# Patient Record
Sex: Male | Born: 1960
Health system: Southern US, Community
[De-identification: ages and names within clinical notes are randomized; demographics above are authoritative.]

## PROBLEM LIST (undated history)

## (undated) DIAGNOSIS — C61 Malignant neoplasm of prostate: Secondary | ICD-10-CM

## (undated) DIAGNOSIS — E039 Hypothyroidism, unspecified: Secondary | ICD-10-CM

## (undated) DIAGNOSIS — I10 Essential (primary) hypertension: Secondary | ICD-10-CM

## (undated) HISTORY — DX: Malignant neoplasm of prostate: C61

## (undated) HISTORY — DX: Essential (primary) hypertension: I10

## (undated) HISTORY — DX: Hypothyroidism, unspecified: E03.9

## (undated) HISTORY — PX: PROSTATECTOMY: SHX69

---

## 1999-05-18 ENCOUNTER — Emergency Department (HOSPITAL_COMMUNITY): Admission: EM | Admit: 1999-05-18 | Discharge: 1999-05-18 | Payer: Self-pay | Admitting: Emergency Medicine

## 2000-12-16 ENCOUNTER — Inpatient Hospital Stay (HOSPITAL_COMMUNITY): Admission: AC | Admit: 2000-12-16 | Discharge: 2001-01-15 | Payer: Self-pay

## 2000-12-16 ENCOUNTER — Encounter: Payer: Self-pay | Admitting: Emergency Medicine

## 2000-12-17 ENCOUNTER — Encounter: Payer: Self-pay | Admitting: Specialist

## 2000-12-17 ENCOUNTER — Encounter: Payer: Self-pay | Admitting: Vascular Surgery

## 2000-12-17 ENCOUNTER — Encounter: Payer: Self-pay | Admitting: Anesthesiology

## 2000-12-18 ENCOUNTER — Encounter: Payer: Self-pay | Admitting: Specialist

## 2000-12-24 ENCOUNTER — Encounter: Payer: Self-pay | Admitting: General Surgery

## 2000-12-27 ENCOUNTER — Encounter: Payer: Self-pay | Admitting: Specialist

## 2001-01-10 ENCOUNTER — Encounter: Payer: Self-pay | Admitting: Specialist

## 2001-04-30 ENCOUNTER — Encounter: Admission: RE | Admit: 2001-04-30 | Discharge: 2001-04-30 | Payer: Self-pay | Admitting: Specialist

## 2001-04-30 ENCOUNTER — Encounter: Payer: Self-pay | Admitting: Specialist

## 2001-07-22 ENCOUNTER — Ambulatory Visit (HOSPITAL_COMMUNITY): Admission: RE | Admit: 2001-07-22 | Discharge: 2001-07-22 | Payer: Self-pay | Admitting: Specialist

## 2001-07-22 ENCOUNTER — Encounter: Payer: Self-pay | Admitting: Specialist

## 2001-10-31 ENCOUNTER — Encounter: Payer: Self-pay | Admitting: Specialist

## 2001-10-31 ENCOUNTER — Encounter: Admission: RE | Admit: 2001-10-31 | Discharge: 2001-10-31 | Payer: Self-pay | Admitting: Specialist

## 2001-12-19 ENCOUNTER — Encounter: Payer: Self-pay | Admitting: Specialist

## 2001-12-19 ENCOUNTER — Inpatient Hospital Stay (HOSPITAL_COMMUNITY): Admission: RE | Admit: 2001-12-19 | Discharge: 2001-12-23 | Payer: Self-pay | Admitting: Specialist

## 2001-12-22 ENCOUNTER — Encounter: Payer: Self-pay | Admitting: Specialist

## 2002-06-12 ENCOUNTER — Encounter: Payer: Self-pay | Admitting: Specialist

## 2002-06-12 ENCOUNTER — Encounter: Admission: RE | Admit: 2002-06-12 | Discharge: 2002-06-12 | Payer: Self-pay | Admitting: Specialist

## 2002-10-22 ENCOUNTER — Encounter: Payer: Self-pay | Admitting: Emergency Medicine

## 2002-10-23 ENCOUNTER — Encounter: Payer: Self-pay | Admitting: Internal Medicine

## 2002-10-23 ENCOUNTER — Observation Stay (HOSPITAL_COMMUNITY): Admission: EM | Admit: 2002-10-23 | Discharge: 2002-10-23 | Payer: Self-pay | Admitting: Emergency Medicine

## 2003-03-03 ENCOUNTER — Encounter: Payer: Self-pay | Admitting: Emergency Medicine

## 2003-03-03 ENCOUNTER — Inpatient Hospital Stay (HOSPITAL_COMMUNITY): Admission: EM | Admit: 2003-03-03 | Discharge: 2003-03-04 | Payer: Self-pay | Admitting: Emergency Medicine

## 2003-06-03 ENCOUNTER — Ambulatory Visit (HOSPITAL_COMMUNITY): Admission: RE | Admit: 2003-06-03 | Discharge: 2003-06-03 | Payer: Self-pay | Admitting: Specialist

## 2004-08-11 ENCOUNTER — Ambulatory Visit: Payer: Self-pay | Admitting: Family Medicine

## 2004-11-21 ENCOUNTER — Emergency Department (HOSPITAL_COMMUNITY): Admission: EM | Admit: 2004-11-21 | Discharge: 2004-11-21 | Payer: Self-pay | Admitting: Emergency Medicine

## 2005-05-18 ENCOUNTER — Encounter: Admission: RE | Admit: 2005-05-18 | Discharge: 2005-05-18 | Payer: Self-pay | Admitting: Specialist

## 2005-10-05 ENCOUNTER — Ambulatory Visit: Payer: Self-pay | Admitting: Internal Medicine

## 2006-06-21 ENCOUNTER — Ambulatory Visit: Payer: Self-pay | Admitting: Internal Medicine

## 2006-08-02 ENCOUNTER — Ambulatory Visit: Payer: Self-pay | Admitting: Internal Medicine

## 2006-08-27 DIAGNOSIS — F411 Generalized anxiety disorder: Secondary | ICD-10-CM | POA: Insufficient documentation

## 2006-08-27 DIAGNOSIS — E039 Hypothyroidism, unspecified: Secondary | ICD-10-CM | POA: Insufficient documentation

## 2006-08-27 DIAGNOSIS — I10 Essential (primary) hypertension: Secondary | ICD-10-CM | POA: Insufficient documentation

## 2006-08-27 DIAGNOSIS — E7210 Disorders of sulfur-bearing amino-acid metabolism, unspecified: Secondary | ICD-10-CM | POA: Insufficient documentation

## 2006-08-27 DIAGNOSIS — Z96619 Presence of unspecified artificial shoulder joint: Secondary | ICD-10-CM

## 2006-08-27 DIAGNOSIS — F329 Major depressive disorder, single episode, unspecified: Secondary | ICD-10-CM

## 2006-08-27 DIAGNOSIS — R945 Abnormal results of liver function studies: Secondary | ICD-10-CM

## 2006-11-29 ENCOUNTER — Ambulatory Visit: Payer: Self-pay | Admitting: Internal Medicine

## 2006-12-04 LAB — CONVERTED CEMR LAB
ALT: 56 units/L — ABNORMAL HIGH (ref 0–40)
AST: 29 units/L (ref 0–37)
Alkaline Phosphatase: 48 units/L (ref 39–117)
BUN: 13 mg/dL (ref 6–23)
Bilirubin, Direct: 0.1 mg/dL (ref 0.0–0.3)
CO2: 28 meq/L (ref 19–32)
Calcium: 9.5 mg/dL (ref 8.4–10.5)
Chloride: 100 meq/L (ref 96–112)
Glucose, Bld: 91 mg/dL (ref 70–99)
Total Protein: 7.2 g/dL (ref 6.0–8.3)

## 2006-12-09 ENCOUNTER — Emergency Department (HOSPITAL_COMMUNITY): Admission: EM | Admit: 2006-12-09 | Discharge: 2006-12-09 | Payer: Self-pay | Admitting: Emergency Medicine

## 2006-12-13 ENCOUNTER — Encounter: Admission: RE | Admit: 2006-12-13 | Discharge: 2006-12-13 | Payer: Self-pay | Admitting: Internal Medicine

## 2006-12-17 ENCOUNTER — Encounter: Admission: RE | Admit: 2006-12-17 | Discharge: 2006-12-17 | Payer: Self-pay | Admitting: Internal Medicine

## 2006-12-17 ENCOUNTER — Encounter: Payer: Self-pay | Admitting: Internal Medicine

## 2006-12-31 ENCOUNTER — Encounter: Admission: RE | Admit: 2006-12-31 | Discharge: 2006-12-31 | Payer: Self-pay | Admitting: Internal Medicine

## 2007-01-17 ENCOUNTER — Encounter: Admission: RE | Admit: 2007-01-17 | Discharge: 2007-01-17 | Payer: Self-pay | Admitting: Internal Medicine

## 2007-03-14 ENCOUNTER — Ambulatory Visit: Payer: Self-pay | Admitting: Internal Medicine

## 2007-04-10 ENCOUNTER — Telehealth: Payer: Self-pay | Admitting: Internal Medicine

## 2007-04-18 ENCOUNTER — Emergency Department (HOSPITAL_COMMUNITY): Admission: EM | Admit: 2007-04-18 | Discharge: 2007-04-18 | Payer: Self-pay | Admitting: Emergency Medicine

## 2007-04-19 ENCOUNTER — Observation Stay (HOSPITAL_COMMUNITY): Admission: EM | Admit: 2007-04-19 | Discharge: 2007-04-21 | Payer: Self-pay | Admitting: Emergency Medicine

## 2007-06-02 ENCOUNTER — Encounter: Payer: Self-pay | Admitting: Internal Medicine

## 2007-12-09 ENCOUNTER — Encounter (INDEPENDENT_AMBULATORY_CARE_PROVIDER_SITE_OTHER): Payer: Self-pay | Admitting: *Deleted

## 2008-07-01 ENCOUNTER — Encounter: Payer: Self-pay | Admitting: Internal Medicine

## 2008-07-22 ENCOUNTER — Encounter: Payer: Self-pay | Admitting: Internal Medicine

## 2010-08-12 ENCOUNTER — Encounter: Payer: Self-pay | Admitting: Specialist
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
## 2010-12-08 NOTE — Consult Note (Signed)
[place]. [facility]  Patient:    [name], [name]                     MRN: [id number] Proc. Date: 12/20/00 Adm. Date:  [id number] Attending:  Trauma, Md CC:         Kerrin Champagne, M.D.   Consultation Report  CHIEF COMPLAINT:  Open wound left leg with exposed bone and bone graft.  HISTORY OF PRESENT ILLNESS:  I was called to the operating room to evaluate urgently the open wound on this patient.  After his bone grafting procedure, it was noted that the muscle would not come over and cover the bone and fracture as it had previously and that the skin was a little bit further compromised laterally from the crush injury which left open wound with open bone grafting.  This was an urgent situation which had arisen unexpectedly. It was the feeling of Dr. Otelia Sergeant and myself that the soleus muscle flap would be a more stable and safer coverage over the bone graft.  For further details of HPI and past medical history, please see the previous consultation.  At this point, we went out and discussed the situation with Dr. Jason Fila wife. We explained to her the lack of adequate soft tissue coverage over the bone fracture and the bone graft as well as the fact that there was no muscle it would cover, and there was not any uncompromised skin that would cover this area.  The recommendation was muscle flap coverage.  This was explained to her, and the fact that it was high risk, including the possibility of loss of the muscle and the possibility of wound infection and would healing problems and eventually amputation.  Nevertheless, the feeling was that he was at high risk for infection of his bone graft if the graft was not covered by adequate soft tissue, particularly muscle, and she did agree to proceeding with the surgery.  This, again, was an urgent consultation.  The decision was made at this time to go ahead and proceed with the muscle flap. DD:  12/20/00 TD:   12/21/00 Job: [id number]
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
## 2010-12-08 NOTE — Op Note (Signed)
[place]. [facility]  Patient:    [name], [name]                     MRN: [id number] Proc. Date: 12/20/00 Adm. Date:  [id number] Attending:  Trauma, Md                           Operative Report  PREOPERATIVE DIAGNOSIS:  Complicated open wound to the left lower leg with exposed bone graft and exposed fracture site.  POSTOPERATIVE DIAGNOSIS:  Complicated open wound to the left lower leg with exposed bone graft and exposed fracture site.  PROCEDURES: 1. Wound debridement, left leg. 2. Soleus muscle flap.  SURGEON:  Teena Irani. Odis Luster, M.D.  ASSISTANT:  Kerrin Champagne, M.D.  DRAINS:  Two Hemovacs left.  ANESTHESIA:  General.  CLINICAL NOTE:  A 50 year old man with multiple orthopedic injuries, including severe fracture of the tibia and a vascular injury as well.  He had undergone vascular bypass grafting and also undergone stabilization of the fracture.  He presented today for his bone grafting.  At the conclusion, there was not adequate coverage for his bone graft to the fracture site, and a muscle flap was felt to be urgently needed.  This was discussed with his wife in detail, including the risks and possible complications, and she did, in fact, wish to proceed.  DESCRIPTION OF PROCEDURE:  The incision was extended medially to a couple of fingerbreadths posterior to the tibial border.  Hemostasis with electrocautery.  The underlying soleus muscle was identified distally.  The interval between the soleus muscle and the gastrocnemius muscle was identified.  The soleus muscle was then mobilized very carefully, taking great care to avoid damage to the underlying posterior tibial neurovascular bundle. Ligaclips were used to ligate perforating vessels to the muscle.  Muscle was taken off the Achilles tendon very carefully and was elevated in a cephalad direction.  The muscle was rotated into position over the bones of the fracture site and the bone  graft, and it was found to reach without tension. Thorough irrigation with saline and meticulous hemostasis having been confirmed, Hemovac drains were positioned, one in the recipient area over the tibia, and another in the donor area posterior leg, and brought out through separate stab wounds inferiorly.  The muscle flap was then inset using 2-0 nylon horizontal mattress sutures just through the fascia only of the muscle and then back through the skin.  These were tied over Xeroform gauze bolsters in order to hold the muscle in position as it was advanced over the bone. Skin edges imbricated down onto the muscle using 2-0 Vicryl interrupted sutures from dermal skin to muscle.  This was performed after having debrided the skin edges through the epidermolysis area back to nice, viable skin with bright red bleeding at the skin edge.  The wounds proximal and distal were closed with 2-0 Vicryl in interrupted inverted deep dermal sutures and skin staples.  The muscle appeared to be very healthy, with good color, and appeared to be viable.  Xeroform gauze, dry sterile dressings placed.  Dr. Otelia Sergeant then proceeded with the shoulder surgery and said that he would place the posterior splint at the conclusion of his procedure. DD:  12/20/00 TD:  12/21/00 Job: [id number]
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
## 2011-05-03 LAB — DIFFERENTIAL
Basophils Absolute: 0
Eosinophils Relative: 1
Lymphocytes Relative: 26
Lymphs Abs: 2.3
Neutro Abs: 5.6
Neutrophils Relative %: 64

## 2011-05-03 LAB — COMPREHENSIVE METABOLIC PANEL
AST: 29
Albumin: 4.1
Alkaline Phosphatase: 68
CO2: 23
Chloride: 102
Creatinine, Ser: 0.89
GFR calc Af Amer: >60
GFR calc non Af Amer: >60
Potassium: 3.6
Total Bilirubin: 1.5 — ABNORMAL HIGH

## 2011-05-03 LAB — CBC
HCT: 40.4
Hemoglobin: 13.3
MCHC: 34.8
MCV: 97.7
Platelets: 301
RBC: 3.91 — ABNORMAL LOW
RDW: 11.8
WBC: 6.2
WBC: 8.8

## 2011-05-03 LAB — BASIC METABOLIC PANEL
CO2: 28
Chloride: 99
Creatinine, Ser: 0.97
GFR calc Af Amer: >60
Potassium: 3.4 — ABNORMAL LOW
Sodium: 135

## 2011-05-03 LAB — RAPID URINE DRUG SCREEN, HOSP PERFORMED
Amphetamines: NOT DETECTED
Benzodiazepines: POSITIVE — AB
Cocaine: NOT DETECTED
Opiates: NOT DETECTED
Tetrahydrocannabinol: NOT DETECTED

## 2011-05-03 LAB — PROTIME-INR: INR: 0.9

## 2013-05-05 ENCOUNTER — Telehealth: Payer: Self-pay | Admitting: *Deleted

## 2013-05-05 NOTE — Telephone Encounter (Signed)
Received paperwork from [facility] for "Annual Follow up on cancer patients. Pt has not been seen by Dr.Paz since 02/2007, this information was provided and mailed back.

## 2018-08-22 ENCOUNTER — Ambulatory Visit (INDEPENDENT_AMBULATORY_CARE_PROVIDER_SITE_OTHER): Payer: 59

## 2018-08-22 ENCOUNTER — Ambulatory Visit (INDEPENDENT_AMBULATORY_CARE_PROVIDER_SITE_OTHER): Payer: 59 | Admitting: Specialist

## 2018-08-22 ENCOUNTER — Encounter (INDEPENDENT_AMBULATORY_CARE_PROVIDER_SITE_OTHER): Payer: Self-pay | Admitting: Specialist

## 2018-08-22 VITALS — BP 130/81 | HR 79 | Ht 73.0 in | Wt 247.0 lb

## 2018-08-22 DIAGNOSIS — M545 Low back pain, unspecified: Secondary | ICD-10-CM

## 2018-08-22 DIAGNOSIS — M4807 Spinal stenosis, lumbosacral region: Secondary | ICD-10-CM

## 2018-08-22 MED ORDER — GABAPENTIN 300 MG PO CAPS: 300.0000 mg | ORAL_CAPSULE | Freq: Every day | ORAL | 3 refills | Status: DC

## 2018-08-22 MED ORDER — MELOXICAM 15 MG PO TABS: 15.0000 mg | ORAL_TABLET | Freq: Every day | ORAL | 3 refills | Status: DC
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
## 2018-08-22 NOTE — Patient Instructions (Addendum)
  Plan: Avoid frequent bending and stooping  No lifting greater than 10 lbs. May use ice or moist heat for pain. Weight loss is of benefit. Best medication for lumbar disc disease is arthritis medications like motrin, celebrex and naprosyn. Exercise is important to improve your indurance and does allow people to function better inspite of back pain. MRI of the lumbar spine is ordered.Marland Kitchen

## 2018-09-05 ENCOUNTER — Ambulatory Visit
Admission: RE | Admit: 2018-09-05 | Discharge: 2018-09-05 | Disposition: A | Discharge status: Home / Self Care | Payer: 59 | Source: Ambulatory Visit | Attending: Specialist | Admitting: Specialist

## 2018-09-05 DIAGNOSIS — M4807 Spinal stenosis, lumbosacral region: Secondary | ICD-10-CM

## 2018-09-29 ENCOUNTER — Other Ambulatory Visit (INDEPENDENT_AMBULATORY_CARE_PROVIDER_SITE_OTHER): Payer: Self-pay | Admitting: Specialist

## 2018-10-03 ENCOUNTER — Ambulatory Visit (INDEPENDENT_AMBULATORY_CARE_PROVIDER_SITE_OTHER): Payer: 59 | Admitting: Specialist

## 2018-10-03 ENCOUNTER — Encounter (INDEPENDENT_AMBULATORY_CARE_PROVIDER_SITE_OTHER): Payer: Self-pay | Admitting: Specialist

## 2018-10-03 ENCOUNTER — Other Ambulatory Visit: Payer: Self-pay

## 2018-10-03 VITALS — BP 144/85 | HR 75 | Ht 73.0 in | Wt 247.0 lb

## 2018-10-03 DIAGNOSIS — M48062 Spinal stenosis, lumbar region with neurogenic claudication: Secondary | ICD-10-CM

## 2018-10-03 DIAGNOSIS — M5126 Other intervertebral disc displacement, lumbar region: Secondary | ICD-10-CM | POA: Diagnosis not present
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
## 2018-10-03 NOTE — Patient Instructions (Signed)
Plan: Avoid bending, stooping and avoid lifting weights greater than 10 lbs. Avoid prolong standing and walking. Avoid frequent bending and stooping  No lifting greater than 10 lbs. May use ice or moist heat for pain. Weight loss is of benefit. Handicap license is approved. Dr. Newton's secretary/Assistant will call to arrange for epidural steroid injection  

## 2018-10-15 ENCOUNTER — Telehealth (INDEPENDENT_AMBULATORY_CARE_PROVIDER_SITE_OTHER): Payer: Self-pay | Admitting: Physical Medicine and Rehabilitation

## 2018-10-15 NOTE — Telephone Encounter (Signed)
Unfortunately, he needs to understand that others are in the same boat.  If his symptoms are worsening to the point where he is not able to function at all with medications and current treatment then let me know.  We are all frustrated with that at this point.  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

## 2018-10-15 NOTE — Telephone Encounter (Signed)
Called patient to advise. He states that he is able to function with current treatment, and he understands the current scheduling restrictions.

## 2018-10-21 ENCOUNTER — Encounter (INDEPENDENT_AMBULATORY_CARE_PROVIDER_SITE_OTHER): Payer: Self-pay | Admitting: Physical Medicine and Rehabilitation

## 2018-11-07 ENCOUNTER — Ambulatory Visit (INDEPENDENT_AMBULATORY_CARE_PROVIDER_SITE_OTHER): Payer: 59 | Admitting: Specialist

## 2018-11-11 ENCOUNTER — Encounter (INDEPENDENT_AMBULATORY_CARE_PROVIDER_SITE_OTHER): Payer: Self-pay | Admitting: Physical Medicine and Rehabilitation

## 2018-11-17 ENCOUNTER — Telehealth (INDEPENDENT_AMBULATORY_CARE_PROVIDER_SITE_OTHER): Payer: Self-pay | Admitting: Physical Medicine and Rehabilitation

## 2018-11-17 NOTE — Telephone Encounter (Signed)
Please see message below. I have moved patient's [employer] appointment to this Thursday, 4/30. I have also scheduled him for an OV with Dr. Louanne Skye Wednesday 4/29. Let me know if this needs to be changed.

## 2018-11-17 NOTE — Telephone Encounter (Signed)
We can, but if he is having weakness that is worsening we needs to see [name] as well. The injection is for pain not weakness. Has severe stenosis.

## 2018-11-17 NOTE — Telephone Encounter (Signed)
Yes please if you dont mind.  About 2 week after injection

## 2018-11-18 NOTE — Telephone Encounter (Signed)
If he is having weakness that is new/progressive he needs to keep or have appointment with [name]. [name] was looking at the normal follow up post injeciton

## 2018-11-18 NOTE — Telephone Encounter (Signed)
Ok to wait until 2 weeks post injection to see Dr. Louanne Skye, or keep appointment this week for weakness/ dragging leg?

## 2018-11-19 ENCOUNTER — Encounter (INDEPENDENT_AMBULATORY_CARE_PROVIDER_SITE_OTHER): Payer: Self-pay | Admitting: Specialist

## 2018-11-19 ENCOUNTER — Ambulatory Visit (INDEPENDENT_AMBULATORY_CARE_PROVIDER_SITE_OTHER): Payer: 59 | Admitting: Specialist

## 2018-11-19 ENCOUNTER — Telehealth: Payer: Self-pay

## 2018-11-19 ENCOUNTER — Other Ambulatory Visit: Payer: Self-pay

## 2018-11-19 VITALS — BP 149/93 | HR 82 | Ht 73.0 in | Wt 247.0 lb

## 2018-11-19 DIAGNOSIS — M48062 Spinal stenosis, lumbar region with neurogenic claudication: Secondary | ICD-10-CM

## 2018-11-19 DIAGNOSIS — M5416 Radiculopathy, lumbar region: Secondary | ICD-10-CM

## 2018-11-19 NOTE — Progress Notes (Signed)
Office Visit Note   Patient: [name]           Date of Birth: [birth year]           MRN: [id number] Visit Date: 11/19/2018              Requested by: Colon Branch, [address] [place], [place] [address] PCP: Colon Branch, MD   Assessment & Plan: Visit Diagnoses:  1. Spinal stenosis of lumbar region with neurogenic claudication   2. Lumbar radiculopathy     Plan: We will have patient proceed with lumbar ESI's as scheduled tomorrow.  Dr. Louanne Skye would like left L2-3 and left L3-4 transforaminal ESI's.  This message was relayed to Dr. Romona Curls assistant.  Follow-up in 2 weeks for recheck to see how he is feeling.  With his new feeling of left quad weakness it may ultimately come down to him needing surgical intervention with L2-3 and L3-4 decompression sooner than later.  Patient seen and examined with Dr. Louanne Skye today.  Follow-Up Instructions: Return in about 2 weeks (around 12/03/2018) for recheck after lumbar ESI.   Orders:  No orders of the defined types were placed in this encounter.  No orders of the defined types were placed in this encounter.     Procedures: No procedures performed   Clinical Data: No additional findings.   Subjective: Chief Complaint  Patient presents with  . Lower Back - Follow-up    HPI Patient returns with complaints of worsening low back pain, bilateral lower extremity radiculopathy/neurogenic claudication and new left hip flexor/quad weakness.  Patient last seen by Dr. Louanne Skye October 03, 2018 and states that his symptoms have worsened since that visit.  He was scheduled for lumbar ESI but this was rescheduled due to [place].  He now does have an appointment with Dr. Ernestina Patches tomorrow morning.  Front of left leg weakness ongoing x2 weeks.  States that he is worried when he goes up and down stairs and feels like he will fall.  No complaints of bowel or bladder incontinence.  Previous lumbar MRI September 05, 2018 showed:  EXAM: MRI  LUMBAR SPINE WITHOUT CONTRAST  TECHNIQUE: Multiplanar, multisequence MR imaging of the lumbar spine was performed. No intravenous contrast was administered.  COMPARISON:  Radiography 08/22/2018. CT abdomen 12/23/2015. Lumbar MRI 12/13/2006.  FINDINGS: Segmentation: 5 lumbar type vertebral bodies as numbered previously.  Alignment: 2 mm retrolisthesis L1-2. 3 mm retrolisthesis L2-3. 2 mm retrolisthesis L3-4.  Vertebrae: Benign appearing hemangioma within the left side of the L2 vertebral body. Chronic degenerative endplate changes on the right at L2-3, with some marrow edema that could be associated with pain.  Conus medullaris and cauda equina: Conus extends to the T12-L1 level. Conus and cauda equina appear normal.  Paraspinal and other soft tissues: Negative  Disc levels:  T11-12: Central disc herniation indents the ventral subarachnoid space but does not visibly compress the cord or show foraminal extension.  T12-L1: Disc degeneration and disc bulge that indents the ventral subarachnoid space. No compressive stenosis. This has worsened since 2008.  L1-2: Endplate osteophytes and shallow protrusion of the disc more prominent towards the right. Facet and ligamentous hypertrophy. Stenosis of the lateral recesses right more than left. Neural compression could occur on the right at this level. This has worsened since the previous study.  L2-3: Advanced chronic disc degeneration with loss of disc height worse on the right. 3 mm retrolisthesis. Endplate osteophytes. Broad-based disc herniation with  caudal migration of disc material more to the right of midline. Facet and ligamentous hypertrophy. Severe spinal stenosis at this level that could cause neural compression on either or both sides, more likely the right. This has worsened considerably since the previous study.  L3-4: 2 mm retrolisthesis. Endplate osteophytes and bulging of the disc. Left  posterolateral to foraminal disc protrusion. Mild stenosis of both lateral recesses. Left foraminal stenosis that could affect the exiting L3 nerve. This has worsened since the previous study.  L4-5: Bulging of the disc. Mild facet and ligamentous hypertrophy. Mild narrowing of the lateral recesses and foramina but without likely neural compression.  L5-S1: Minimal disc bulge.  No canal or foraminal stenosis.  IMPRESSION: L2-3: 3 mm retrolisthesis. Disc degeneration with endplate osteophytes and broad-based herniation of disc material more prominent towards the right, with caudal migration more prominent towards the right. Facet and ligamentous hypertrophy. Severe spinal stenosis that could cause neural compression on either or both sides, particularly the right.  T11-12: Central disc herniation but without apparent neural compression.  L1-2: Endplate osteophytes and shallow disc protrusion more prominent towards the right. Stenosis of the lateral recesses right more than left. Potential for neural compression in the right lateral recess.  L3-4: 2 mm retrolisthesis. Shallow protrusion of the disc with focal herniation in the left foraminal region. Bilateral lateral recess narrowing but without distinct neural compression. Left foraminal encroachment could focally affect the left L3 nerve.  L4-5: Disc bulge. Mild stenosis of both lateral recesses and foramina but without likely neural compression.    Objective: Vital Signs: BP (!) 149/93 (BP Location: Left Arm, Patient Position: Sitting)   Pulse 82   Ht 6\' 1"  (1.854 m)   Wt 247 lb (112 kg)   BMI 32.59 kg/m   Physical Exam HENT:     Head: Normocephalic and atraumatic.  Eyes:     Extraocular Movements: Extraocular movements intact.     Pupils: Pupils are equal, round, and reactive to light.  Pulmonary:     Effort: No respiratory distress.  Musculoskeletal:     Comments: Patient has lumbar paraspinal  tenderness.  He is moderately tender over the left hip greater trochanter bursa.  Negative logroll bilateral hips.  Negative straight leg raise.  Question trace left hip flexor/quad weakness.  Skin:    General: Skin is warm and dry.  Neurological:     General: No focal deficit present.     Mental Status: He is alert.  Psychiatric:        Mood and Affect: Mood normal.        Behavior: Behavior normal.     Ortho Exam  Specialty Comments:  No specialty comments available.  Imaging: No results found.   PMFS History: Patient Active Problem List   Diagnosis Date Noted  . HYPOTHYROIDISM 08/27/2006  . HYPERHOMOCYSTEINEMIA 08/27/2006  . ANXIETY 08/27/2006  . DEPRESSION 08/27/2006  . HYPERTENSION 08/27/2006  . LIVER FUNCTION TESTS, ABNORMAL 08/27/2006  . STATUS, SHOULDER JOINT REPLACEMENT 08/27/2006   History reviewed. No pertinent past medical history.  History reviewed. No pertinent family history.  History reviewed. No pertinent surgical history. Social History   Occupational History  . Not on file  Tobacco Use  . Smoking status: Former Research scientist (life sciences)  . Smokeless tobacco: Current User    Types: Snuff  Substance and Sexual Activity  . Alcohol use: Not Currently    Frequency: Never  . Drug use: Never  . Sexual activity: Yes

## 2018-11-19 NOTE — Telephone Encounter (Signed)
Last OV in 2011- re-establish care? Or getting care elsewhere?

## 2018-11-20 ENCOUNTER — Encounter (INDEPENDENT_AMBULATORY_CARE_PROVIDER_SITE_OTHER): Payer: Self-pay | Admitting: Physical Medicine and Rehabilitation

## 2018-11-20 ENCOUNTER — Ambulatory Visit (INDEPENDENT_AMBULATORY_CARE_PROVIDER_SITE_OTHER): Payer: 59 | Admitting: Physical Medicine and Rehabilitation

## 2018-11-20 ENCOUNTER — Ambulatory Visit (INDEPENDENT_AMBULATORY_CARE_PROVIDER_SITE_OTHER): Payer: Self-pay

## 2018-11-20 VITALS — BP 125/74 | HR 65

## 2018-11-20 DIAGNOSIS — M5116 Intervertebral disc disorders with radiculopathy, lumbar region: Secondary | ICD-10-CM

## 2018-11-20 DIAGNOSIS — M48062 Spinal stenosis, lumbar region with neurogenic claudication: Secondary | ICD-10-CM

## 2018-11-20 DIAGNOSIS — M5416 Radiculopathy, lumbar region: Secondary | ICD-10-CM

## 2018-11-20 MED ORDER — BETAMETHASONE SOD PHOS & ACET 6 (3-3) MG/ML IJ SUSP
12.0000 mg | Freq: Once | INTRAMUSCULAR | Status: AC
  Administered 2018-11-20: 09:00:00 12 mg

## 2018-11-20 NOTE — Procedures (Signed)
Lumbosacral Transforaminal Epidural Steroid Injection - Sub-Pedicular Approach with Fluoroscopic Guidance  Patient: [name]      Date of Birth: [birth year] MRN: [id number] PCP: Patient, No Pcp Per      Visit Date: 11/20/2018   Universal Protocol:    Date/Time: 11/20/2018  Consent Given By: the patient  Position: PRONE  Additional Comments: Vital signs were monitored before and after the procedure. Patient was prepped and draped in the usual sterile fashion. The correct patient, procedure, and site was verified.   Injection Procedure Details:  Procedure Site One Meds Administered:  Meds ordered this encounter  Medications  . betamethasone acetate-betamethasone sodium phosphate (CELESTONE) injection 12 mg    Laterality: Left  Location/Site:  L2-L3 L3-L4  Needle size: 22 G  Needle type: Spinal  Needle Placement: Transforaminal  Findings:    -Comments: Excellent flow of contrast along the nerve and into the epidural space.  Procedure Details: After squaring off the end-plates to get a true AP view, the C-arm was positioned so that an oblique view of the foramen as noted above was visualized. The target area is just inferior to the "nose of the scotty dog" or sub pedicular. The soft tissues overlying this structure were infiltrated with 2-3 ml. of 1% Lidocaine without Epinephrine.  The spinal needle was inserted toward the target using a "trajectory" view along the fluoroscope beam.  Under AP and lateral visualization, the needle was advanced so it did not puncture dura and was located close the 6 O'Clock position of the pedical in AP tracterory. Biplanar projections were used to confirm position. Aspiration was confirmed to be negative for CSF and/or blood. A 1-2 ml. volume of Isovue-250 was injected and flow of contrast was noted at each level. Radiographs were obtained for documentation purposes.   After attaining the desired flow of contrast documented above, a  0.5 to 1.0 ml test dose of 0.25% Marcaine was injected into each respective transforaminal space.  The patient was observed for 90 seconds post injection.  After no sensory deficits were reported, and normal lower extremity motor function was noted,   the above injectate was administered so that equal amounts of the injectate were placed at each foramen (level) into the transforaminal epidural space.   Additional Comments:  The patient tolerated the procedure well Dressing: 2 x 2 sterile gauze and Band-Aid    Post-procedure details: Patient was observed during the procedure. Post-procedure instructions were reviewed.  Patient left the clinic in stable condition.

## 2018-11-20 NOTE — Progress Notes (Signed)
  Numeric Pain Rating Scale and Functional Assessment °Average Pain 6 ° ° °In the last MONTH (on 0-10 scale) has pain interfered with the following? ° °1. General activity like being  able to carry out your everyday physical activities such as walking, climbing stairs, carrying groceries, or moving a chair?  °Rating(10) ° ° °+Driver, -BT, -Dye Allergies. ° °

## 2018-11-20 NOTE — Progress Notes (Signed)
[name] - 58 y.o. male MRN [id number]  Date of birth: 1961-03-03  Office Visit Note: Visit Date: 11/20/2018 PCP: Patient, No Pcp Per Referred by: Colon Branch, MD  Subjective: Chief Complaint  Patient presents with  . Lower Back - Pain  . Left Hip - Pain   HPI: [name] is a 58 y.o. male who comes in today At the request of Dr. Basil Dess for diagnostic medically therapeutic left L2 and L3 transforaminal epidural steroid injection for chronic worsening low back and left hip and leg pain which is radicular in nature.  MRI findings show severe stenosis at L2-3 with narrowing of the lateral recess.  Disc herniation and arthritis and stenosis.  Patient is failed conservative care otherwise symptoms are getting worse.  He saw Dr. Sigurd Sos yesterday.  ROS Otherwise per HPI.  Assessment & Plan: Visit Diagnoses:  1. Lumbar radiculopathy   2. Radiculopathy due to lumbar intervertebral disc disorder   3. Spinal stenosis of lumbar region with neurogenic claudication     Plan: No additional findings.   Meds & Orders:  Meds ordered this encounter  Medications  . betamethasone acetate-betamethasone sodium phosphate (CELESTONE) injection 12 mg    Orders Placed This Encounter  Procedures  . XR C-ARM NO REPORT  . Epidural Steroid injection    Follow-up: Return if symptoms worsen or fail to improve, for Dr. Basil Dess.   Procedures: No procedures performed  Lumbosacral Transforaminal Epidural Steroid Injection - Sub-Pedicular Approach with Fluoroscopic Guidance  Patient: [name]      Date of Birth: 1960/11/17 MRN: [id number] PCP: Patient, No Pcp Per      Visit Date: 11/20/2018   Universal Protocol:    Date/Time: 11/20/2018  Consent Given By: the patient  Position: PRONE  Additional Comments: Vital signs were monitored before and after the procedure. Patient was prepped and draped in the usual sterile fashion. The correct patient, procedure, and site  was verified.   Injection Procedure Details:  Procedure Site One Meds Administered:  Meds ordered this encounter  Medications  . betamethasone acetate-betamethasone sodium phosphate (CELESTONE) injection 12 mg    Laterality: Left  Location/Site:  L2-L3 L3-L4  Needle size: 22 G  Needle type: Spinal  Needle Placement: Transforaminal  Findings:    -Comments: Excellent flow of contrast along the nerve and into the epidural space.  Procedure Details: After squaring off the end-plates to get a true AP view, the C-arm was positioned so that an oblique view of the foramen as noted above was visualized. The target area is just inferior to the "nose of the scotty dog" or sub pedicular. The soft tissues overlying this structure were infiltrated with 2-3 ml. of 1% Lidocaine without Epinephrine.  The spinal needle was inserted toward the target using a "trajectory" view along the fluoroscope beam.  Under AP and lateral visualization, the needle was advanced so it did not puncture dura and was located close the 6 O'Clock position of the pedical in AP tracterory. Biplanar projections were used to confirm position. Aspiration was confirmed to be negative for CSF and/or blood. A 1-2 ml. volume of Isovue-250 was injected and flow of contrast was noted at each level. Radiographs were obtained for documentation purposes.   After attaining the desired flow of contrast documented above, a 0.5 to 1.0 ml test dose of 0.25% Marcaine was injected into each respective transforaminal space.  The patient was observed for 90 seconds post injection.  After no sensory  deficits were reported, and normal lower extremity motor function was noted,   the above injectate was administered so that equal amounts of the injectate were placed at each foramen (level) into the transforaminal epidural space.   Additional Comments:  The patient tolerated the procedure well Dressing: 2 x 2 sterile gauze and Band-Aid     Post-procedure details: Patient was observed during the procedure. Post-procedure instructions were reviewed.  Patient left the clinic in stable condition.     Clinical History: MRI LUMBAR SPINE WITHOUT CONTRAST  TECHNIQUE: Multiplanar, multisequence MR imaging of the lumbar spine was performed. No intravenous contrast was administered.  COMPARISON:  Radiography 08/22/2018. CT abdomen 12/23/2015. Lumbar MRI 12/13/2006.  FINDINGS: Segmentation: 5 lumbar type vertebral bodies as numbered previously.  Alignment: 2 mm retrolisthesis L1-2. 3 mm retrolisthesis L2-3. 2 mm retrolisthesis L3-4.  Vertebrae: Benign appearing hemangioma within the left side of the L2 vertebral body. Chronic degenerative endplate changes on the right at L2-3, with some marrow edema that could be associated with pain.  Conus medullaris and cauda equina: Conus extends to the T12-L1 level. Conus and cauda equina appear normal.  Paraspinal and other soft tissues: Negative  Disc levels:  T11-12: Central disc herniation indents the ventral subarachnoid space but does not visibly compress the cord or show foraminal extension.  T12-L1: Disc degeneration and disc bulge that indents the ventral subarachnoid space. No compressive stenosis. This has worsened since 2008.  L1-2: Endplate osteophytes and shallow protrusion of the disc more prominent towards the right. Facet and ligamentous hypertrophy. Stenosis of the lateral recesses right more than left. Neural compression could occur on the right at this level. This has worsened since the previous study.  L2-3: Advanced chronic disc degeneration with loss of disc height worse on the right. 3 mm retrolisthesis. Endplate osteophytes. Broad-based disc herniation with caudal migration of disc material more to the right of midline. Facet and ligamentous hypertrophy. Severe spinal stenosis at this level that could cause neural compression on  either or both sides, more likely the right. This has worsened considerably since the previous study.  L3-4: 2 mm retrolisthesis. Endplate osteophytes and bulging of the disc. Left posterolateral to foraminal disc protrusion. Mild stenosis of both lateral recesses. Left foraminal stenosis that could affect the exiting L3 nerve. This has worsened since the previous study.  L4-5: Bulging of the disc. Mild facet and ligamentous hypertrophy. Mild narrowing of the lateral recesses and foramina but without likely neural compression.  L5-S1: Minimal disc bulge.  No canal or foraminal stenosis.  IMPRESSION: L2-3: 3 mm retrolisthesis. Disc degeneration with endplate osteophytes and broad-based herniation of disc material more prominent towards the right, with caudal migration more prominent towards the right. Facet and ligamentous hypertrophy. Severe spinal stenosis that could cause neural compression on either or both sides, particularly the right.  T11-12: Central disc herniation but without apparent neural compression.  L1-2: Endplate osteophytes and shallow disc protrusion more prominent towards the right. Stenosis of the lateral recesses right more than left. Potential for neural compression in the right lateral recess.  L3-4: 2 mm retrolisthesis. Shallow protrusion of the disc with focal herniation in the left foraminal region. Bilateral lateral recess narrowing but without distinct neural compression. Left foraminal encroachment could focally affect the left L3 nerve.  L4-5: Disc bulge. Mild stenosis of both lateral recesses and foramina but without likely neural compression.   Electronically Signed   By: Nelson Chimes M.D.   On: 09/05/2018 11:57   He reports  that he has quit smoking. His smokeless tobacco use includes snuff. No results for input(s): HGBA1C, LABURIC in the last 8760 hours.  Objective:  VS:  HT:    WT:   BMI:     BP:125/74  HR:65bpm  TEMP: ( )   RESP:97 % Physical Exam Musculoskeletal:     Comments: Ambulates without aid, forward flexed lumbar spine.      Ortho Exam Imaging: Xr C-arm No Report  Result Date: 11/20/2018 Please see Notes tab for imaging impression.   Past Medical/Family/Surgical/Social History: Medications & Allergies reviewed per EMR, new medications updated. Patient Active Problem List   Diagnosis Date Noted  . HYPOTHYROIDISM 08/27/2006  . HYPERHOMOCYSTEINEMIA 08/27/2006  . ANXIETY 08/27/2006  . DEPRESSION 08/27/2006  . HYPERTENSION 08/27/2006  . LIVER FUNCTION TESTS, ABNORMAL 08/27/2006  . STATUS, SHOULDER JOINT REPLACEMENT 08/27/2006   History reviewed. No pertinent past medical history. History reviewed. No pertinent family history. History reviewed. No pertinent surgical history. Social History   Occupational History  . Not on file  Tobacco Use  . Smoking status: Former Research scientist (life sciences)  . Smokeless tobacco: Current User    Types: Snuff  Substance and Sexual Activity  . Alcohol use: Not Currently    Frequency: Never  . Drug use: Never  . Sexual activity: Yes

## 2018-11-26 NOTE — Telephone Encounter (Signed)
Spoke with pt and pt stated is in the care of his cancer doctor, pt was diagnose with cancer twice and has been treated with his specialist but will let us know if he needs to re establish with [facility].

## 2018-12-03 ENCOUNTER — Encounter (INDEPENDENT_AMBULATORY_CARE_PROVIDER_SITE_OTHER): Payer: Self-pay | Admitting: Physical Medicine and Rehabilitation

## 2018-12-16 ENCOUNTER — Other Ambulatory Visit: Payer: Self-pay | Admitting: Specialist

## 2018-12-16 NOTE — Telephone Encounter (Signed)
meloxicam and Gabapentin refill requests

## 2018-12-18 ENCOUNTER — Ambulatory Visit: Payer: Self-pay | Admitting: Specialist

## 2018-12-19 ENCOUNTER — Ambulatory Visit: Payer: 59 | Admitting: Specialist

## 2019-01-11 ENCOUNTER — Other Ambulatory Visit: Payer: Self-pay | Admitting: Specialist

## 2019-01-12 NOTE — Telephone Encounter (Signed)
Gabapentin refill request ---90 day supply

## 2019-04-12 ENCOUNTER — Other Ambulatory Visit: Payer: Self-pay | Admitting: Specialist

## 2019-04-13 NOTE — Telephone Encounter (Signed)
melxoicam

## 2019-08-07 ENCOUNTER — Other Ambulatory Visit: Payer: Self-pay | Admitting: Specialist

## 2019-10-24 ENCOUNTER — Other Ambulatory Visit: Payer: Self-pay | Admitting: Specialist

## 2019-12-04 ENCOUNTER — Other Ambulatory Visit: Payer: Self-pay | Admitting: Specialist

## 2020-02-12 ENCOUNTER — Ambulatory Visit: Payer: 59 | Admitting: Internal Medicine

## 2020-02-12 ENCOUNTER — Other Ambulatory Visit: Payer: Self-pay

## 2020-02-12 VITALS — BP 138/96 | HR 76 | Temp 98.6°F | Resp 16 | Ht 72.0 in | Wt 237.0 lb

## 2020-02-12 DIAGNOSIS — I10 Essential (primary) hypertension: Secondary | ICD-10-CM

## 2020-02-12 DIAGNOSIS — Z23 Encounter for immunization: Secondary | ICD-10-CM | POA: Diagnosis not present

## 2020-02-12 DIAGNOSIS — E039 Hypothyroidism, unspecified: Secondary | ICD-10-CM | POA: Diagnosis not present

## 2020-02-12 DIAGNOSIS — E876 Hypokalemia: Secondary | ICD-10-CM | POA: Diagnosis not present

## 2020-02-12 DIAGNOSIS — L03116 Cellulitis of left lower limb: Secondary | ICD-10-CM | POA: Insufficient documentation

## 2020-02-12 MED ORDER — NUZYRA 150 MG PO TABS: 2.0000 | ORAL_TABLET | Freq: Every day | ORAL | 0 refills | Status: AC

## 2020-02-12 MED ORDER — NUZYRA 150 MG PO TABS: 3.0000 | ORAL_TABLET | Freq: Every day | ORAL | 0 refills | Status: AC

## 2020-02-12 NOTE — Patient Instructions (Signed)

## 2020-02-12 NOTE — Progress Notes (Signed)
Subjective:  Patient ID: [name], male    DOB: 13-Dec-1960  Age: 59 y.o. MRN: [id number]  CC: Cellulitis  This visit occurred during the SARS-CoV-2 public health emergency.  Safety protocols were in place, including screening questions prior to the visit, additional usage of staff PPE, and extensive cleaning of exam room while observing appropriate contact time as indicated for disinfecting solutions.    NEW TO ME  HPI [name] presents for the acute onset of pain, redness, and swelling in his left lower leg.  There is a spot just above his medial ankle that he thinks is a wound.  He denies nausea, vomiting, fever, chills, headache, shortness of breath, dizziness, or lightheadedness.  History Ramere has a past medical history of Hypertension, Hypothyroidism (acquired), and Prostate cancer ([place]).   He has a past surgical history that includes Prostatectomy.   His family history includes Diabetes in his father.He reports that he has quit smoking. His smokeless tobacco use includes snuff. He reports previous alcohol use. He reports that he does not use drugs.  Outpatient Medications Prior to Visit  Medication Sig Dispense Refill  . amLODipine-benazepril (LOTREL) 10-20 MG capsule Take by mouth.    . cloNIDine (CATAPRES) 0.1 MG tablet TAKE 1/2 TABLET BY MOUTH 3 TIMES A DAY    . gabapentin (NEURONTIN) 300 MG capsule TAKE 1 CAPSULE BY MOUTH EVERYDAY AT BEDTIME 90 capsule 2  . levothyroxine (SYNTHROID, LEVOTHROID) 200 MCG tablet Take by mouth.    . meloxicam (MOBIC) 15 MG tablet TAKE 1 TABLET BY MOUTH EVERY DAY 30 tablet 3  . venlafaxine XR (EFFEXOR-XR) 150 MG 24 hr capsule Take by mouth.     No facility-administered medications prior to visit.    ROS Review of Systems  Constitutional: Positive for fatigue and unexpected weight change (wt gain). Negative for chills and fever.  HENT: Negative.   Eyes: Negative for visual disturbance.  Respiratory:  Negative for cough, chest tightness, shortness of breath and wheezing.   Cardiovascular: Positive for leg swelling. Negative for chest pain and palpitations.  Gastrointestinal: Negative for abdominal pain, blood in stool, constipation, diarrhea, nausea and vomiting.  Endocrine: Negative.   Genitourinary: Negative.  Negative for difficulty urinating, dysuria, hematuria, scrotal swelling and testicular pain.  Musculoskeletal: Negative.   Skin: Positive for color change and wound. Negative for pallor and rash.  Neurological: Negative.  Negative for dizziness, weakness, light-headedness and numbness.  Hematological: Negative for adenopathy. Does not bruise/bleed easily.  Psychiatric/Behavioral: Negative.     Objective:  BP (!) 138/96   Pulse 76   Temp 98.6 F (37 C) (Oral)   Resp 16   Ht 6' (1.829 m)   Wt (!) 237 lb (107.5 kg)   SpO2 96%   BMI 32.14 kg/m   Physical Exam Vitals reviewed.  Constitutional:      General: He is not in acute distress.    Appearance: He is obese. He is not toxic-appearing or diaphoretic.  HENT:     Nose: Nose normal.     Mouth/Throat:     Mouth: Mucous membranes are moist.  Eyes:     General: No scleral icterus.    Conjunctiva/sclera: Conjunctivae normal.  Cardiovascular:     Rate and Rhythm: Normal rate and regular rhythm.     Pulses:          Carotid pulses are 1+ on the right side and 1+ on the left side.  Radial pulses are 1+ on the right side and 1+ on the left side.       Femoral pulses are 1+ on the right side and 1+ on the left side.      Popliteal pulses are 1+ on the right side and 1+ on the left side.       Dorsalis pedis pulses are 1+ on the right side and 1+ on the left side.       Posterior tibial pulses are 1+ on the right side and 1+ on the left side.     Heart sounds: No murmur heard.   Pulmonary:     Effort: Pulmonary effort is normal.     Breath sounds: No stridor. No wheezing, rhonchi or rales.  Abdominal:     General:  Abdomen is protuberant. Bowel sounds are normal. There is no distension.     Palpations: Abdomen is soft. There is no hepatomegaly, splenomegaly or mass.     Tenderness: There is no abdominal tenderness.  Musculoskeletal:        General: Deformity present. Normal range of motion.     Cervical back: Neck supple.     Right lower leg: Edema (trace pitting) present.     Left lower leg: Edema (trace pitting) present.     Comments: There is a scar with muscle loss over the left medial calf.  Lymphadenopathy:     Cervical: No cervical adenopathy.  Skin:    Comments: LLE - Just above the left medial malleolus there is a small purplish lesion that looks like a wound.  Extending above and beyond this almost to the knee but not beyond the knee there is erythema, warmth, mild tenderness, and trace pitting edema.  Some of the erythema extends laterally with puffy streaks.  See photos.    Neurological:     General: No focal deficit present.     Mental Status: He is alert and oriented to person, place, and time. Mental status is at baseline.      Lab Results  Component Value Date   WBC 11.6 (H) 02/12/2020   HGB 15.0 02/12/2020   HCT 43.2 02/12/2020   PLT 241 02/12/2020   GLUCOSE 125 (H) 02/12/2020   ALT 24 02/12/2020   AST 21 02/12/2020   NA 134 (L) 02/12/2020   K 3.4 (L) 02/12/2020   CL 99 02/12/2020   CREATININE 1.28 (H) 02/12/2020   BUN 17 02/12/2020   CO2 22 02/12/2020   TSH 8.36 (H) 02/12/2020   INR 0.9 04/19/2007    Assessment & Plan:   Raeshaun was seen today for cellulitis.  Diagnoses and all orders for this visit:  Cellulitis of left lower extremity- He has a mildly elevated white cell count but no evidence of compartment syndrome, abscess, or necrotizing fasciitis.  I recommended that he treat this with omadacycline -     CBC with Differential/Platelet; Future -     Sedimentation rate; Future -     Omadacycline Tosylate (NUZYRA) 150 MG TABS; Take 3 tablets by mouth daily for  2 days. -     Omadacycline Tosylate (NUZYRA) 150 MG TABS; Take 2 tablets by mouth daily for 7 days. -     Sedimentation rate -     CBC with Differential/Platelet  Acquired hypothyroidism- His TSH is modestly elevated and he is symptomatic.  I recommended that he add 50 mcg of levothyroxine to his current 200 mcg dose. -     TSH; Future -  TSH -     levothyroxine (SYNTHROID) 50 MCG tablet; Take 1 tablet (50 mcg total) by mouth daily before breakfast.  Essential hypertension- His blood pressure is not adequately well controlled.  I recommended he improve his lifestyle modifications. -     Hepatic function panel; Future -     BASIC METABOLIC PANEL WITH GFR; Future -     BASIC METABOLIC PANEL WITH GFR -     Hepatic function panel -     potassium chloride (KLOR-CON) 10 MEQ tablet; Take 1 tablet (10 mEq total) by mouth 2 (two) times daily.  Chronic hypokalemia -     potassium chloride (KLOR-CON) 10 MEQ tablet; Take 1 tablet (10 mEq total) by mouth 2 (two) times daily.  Other orders -     Hepatic function panel   I am having [employer] [employer] start on [place], potassium chloride, and levothyroxine. I am also having him maintain his amLODipine-benazepril, cloNIDine, levothyroxine, venlafaxine XR, gabapentin, and meloxicam.  Meds ordered this encounter  Medications  . Omadacycline Tosylate (NUZYRA) 150 MG TABS    Sig: Take 3 tablets by mouth daily for 2 days.    Dispense:  6 tablet    Refill:  0  . Omadacycline Tosylate (NUZYRA) 150 MG TABS    Sig: Take 2 tablets by mouth daily for 7 days.    Dispense:  14 tablet    Refill:  0  . potassium chloride (KLOR-CON) 10 MEQ tablet    Sig: Take 1 tablet (10 mEq total) by mouth 2 (two) times daily.    Dispense:  180 tablet    Refill:  0  . levothyroxine (SYNTHROID) 50 MCG tablet    Sig: Take 1 tablet (50 mcg total) by mouth daily before breakfast.    Dispense:  90 tablet    Refill:  0   I spent 50 minutes in preparing to see the  patient by review of recent labs, imaging and procedures, obtaining and reviewing separately obtained history, communicating with the patient and family or caregiver, ordering medications, tests or procedures, and documenting clinical information in the EHR including the differential Dx, treatment, and any further evaluation and other management of 1. Cellulitis of left lower extremity 2. Acquired hypothyroidism 3. Essential hypertension 4. Chronic hypokalemia     Follow-up: Return in about 1 week (around 02/19/2020).  Scarlette Calico, MD

## 2020-02-13 DIAGNOSIS — E876 Hypokalemia: Secondary | ICD-10-CM | POA: Insufficient documentation

## 2020-02-13 LAB — CBC WITH DIFFERENTIAL/PLATELET
Absolute Monocytes: 522 cells/uL (ref 200–950)
Basophils Absolute: 23 cells/uL (ref 0–200)
Basophils Relative: 0.2 %
Eosinophils Absolute: 12 cells/uL — ABNORMAL LOW (ref 15–500)
Eosinophils Relative: 0.1 %
HCT: 43.2 % (ref 38.5–50.0)
Hemoglobin: 15 g/dL (ref 13.2–17.1)
Lymphs Abs: 1056 cells/uL (ref 850–3900)
MCH: 32.1 pg (ref 27.0–33.0)
MCHC: 34.7 g/dL (ref 32.0–36.0)
MCV: 92.5 fL (ref 80.0–100.0)
MPV: 9.6 fL (ref 7.5–12.5)
Monocytes Relative: 4.5 %
Neutro Abs: 9988 cells/uL — ABNORMAL HIGH (ref 1500–7800)
Neutrophils Relative %: 86.1 %
Platelets: 241 10*3/uL (ref 140–400)
RBC: 4.67 10*6/uL (ref 4.20–5.80)
RDW: 12.4 % (ref 11.0–15.0)
Total Lymphocyte: 9.1 %
WBC: 11.6 10*3/uL — ABNORMAL HIGH (ref 3.8–10.8)

## 2020-02-13 LAB — BASIC METABOLIC PANEL WITH GFR
BUN/Creatinine Ratio: 13 (calc) (ref 6–22)
BUN: 17 mg/dL (ref 7–25)
CO2: 22 mmol/L (ref 20–32)
Calcium: 9.7 mg/dL (ref 8.6–10.3)
Chloride: 99 mmol/L (ref 98–110)
Creat: 1.28 mg/dL — ABNORMAL HIGH (ref 0.70–1.25)
GFR, Est African American: 70 mL/min/{1.73_m2} (ref >=60)
GFR, Est Non African American: 60 mL/min/{1.73_m2} (ref >=60)
Glucose, Bld: 125 mg/dL — ABNORMAL HIGH (ref 65–99)
Potassium: 3.4 mmol/L — ABNORMAL LOW (ref 3.5–5.3)
Sodium: 134 mmol/L — ABNORMAL LOW (ref 135–146)

## 2020-02-13 LAB — TSH: TSH: 8.36 mIU/L — ABNORMAL HIGH (ref 0.40–4.50)

## 2020-02-13 LAB — HEPATIC FUNCTION PANEL
AG Ratio: 1.4 (calc) (ref 1.0–2.5)
ALT: 24 U/L (ref 9–46)
AST: 21 U/L (ref 10–35)
Albumin: 4.2 g/dL (ref 3.6–5.1)
Alkaline phosphatase (APISO): 66 U/L (ref 35–144)
Bilirubin, Direct: 0.1 mg/dL (ref 0.0–0.2)
Globulin: 3.1 g/dL (calc) (ref 1.9–3.7)
Indirect Bilirubin: 0.4 mg/dL (calc) (ref 0.2–1.2)
Total Bilirubin: 0.5 mg/dL (ref 0.2–1.2)
Total Protein: 7.3 g/dL (ref 6.1–8.1)

## 2020-02-13 MED ORDER — LEVOTHYROXINE SODIUM 50 MCG PO TABS: 50.0000 ug | ORAL_TABLET | Freq: Every day | ORAL | 0 refills | Status: DC

## 2020-02-13 MED ORDER — POTASSIUM CHLORIDE ER 10 MEQ PO TBCR: 10.0000 meq | EXTENDED_RELEASE_TABLET | Freq: Two times a day (BID) | ORAL | 0 refills | Status: AC

## 2020-02-14 ENCOUNTER — Encounter: Payer: Self-pay | Admitting: Internal Medicine

## 2020-02-15 NOTE — Addendum Note (Signed)
Addended by: Marijean Heath R on: 02/15/2020 02:32 PM   Modules accepted: Orders

## 2020-03-30 ENCOUNTER — Other Ambulatory Visit: Payer: Self-pay | Admitting: Specialist

## 2020-05-09 ENCOUNTER — Other Ambulatory Visit: Payer: Self-pay | Admitting: Internal Medicine

## 2020-05-09 DIAGNOSIS — E039 Hypothyroidism, unspecified: Secondary | ICD-10-CM

## 2020-07-15 ENCOUNTER — Other Ambulatory Visit: Payer: Self-pay | Admitting: Specialist

## 2020-07-29 ENCOUNTER — Other Ambulatory Visit: Payer: Self-pay | Admitting: Specialist

## 2020-07-31 ENCOUNTER — Other Ambulatory Visit: Payer: Self-pay | Admitting: Internal Medicine

## 2020-07-31 DIAGNOSIS — E039 Hypothyroidism, unspecified: Secondary | ICD-10-CM

## 2020-08-23 IMAGING — MR MR LUMBAR SPINE W/O CM
4 of 5 series · 17 of 48 positions shown · non-contrast
Comparison: Radiography 08/22/2018. CT abdomen 12/23/2015. Lumbar
MRI 12/13/2006.

CLINICAL DATA: Chronic low back pain and bilateral buttock pain.
Pain extends to the legs right worse than left.

EXAM:
MRI LUMBAR SPINE WITHOUT CONTRAST
TECHNIQUE: Multiplanar, multisequence MR imaging of the lumbar spine was
performed. No intravenous contrast was administered.

[Series 6: T2 · sagittal · 4.0mm · 0.73mm/px · 6 of 15 slices shown (1 of 2)]
[im 1/15]
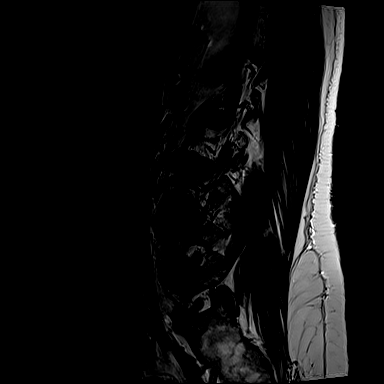
[im 3/15]
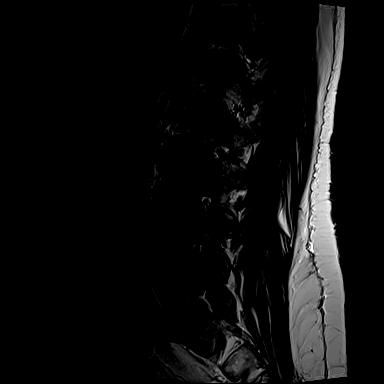
[im 6/15]
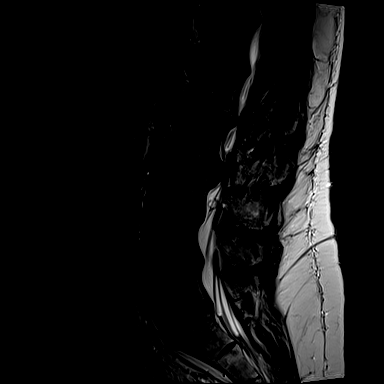
[im 9/15]
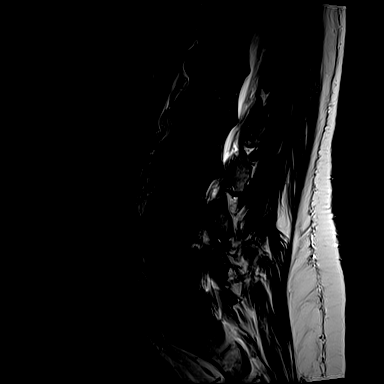
[im 12/15]
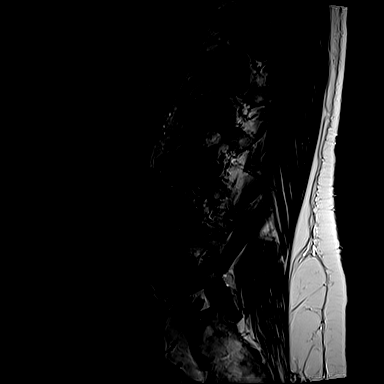
[im 15/15]
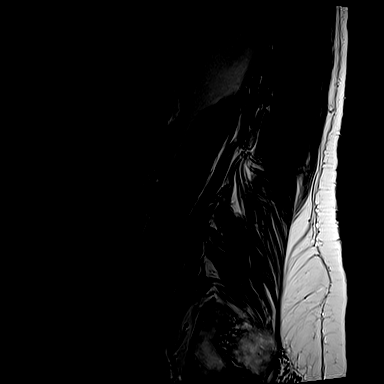

[Series 7: T1 · sagittal · 4.0mm · 0.73mm/px · 3 of 15 slices shown (1 of 2)]
[im 1/15]
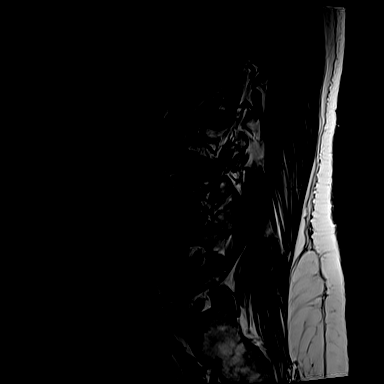
[im 8/15]
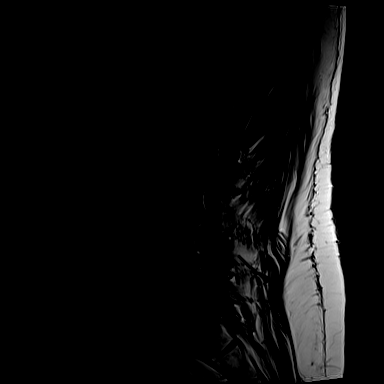
[im 15/15]
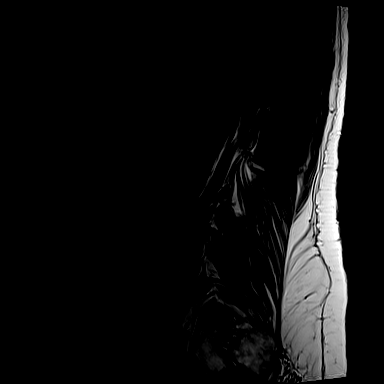

[Series 11: T1 · axial · 4.0mm · 0.28mm/px · z∈[-190,-8]mm · 3 of 43 slices shown (2 of 2)]
[im 6/43]
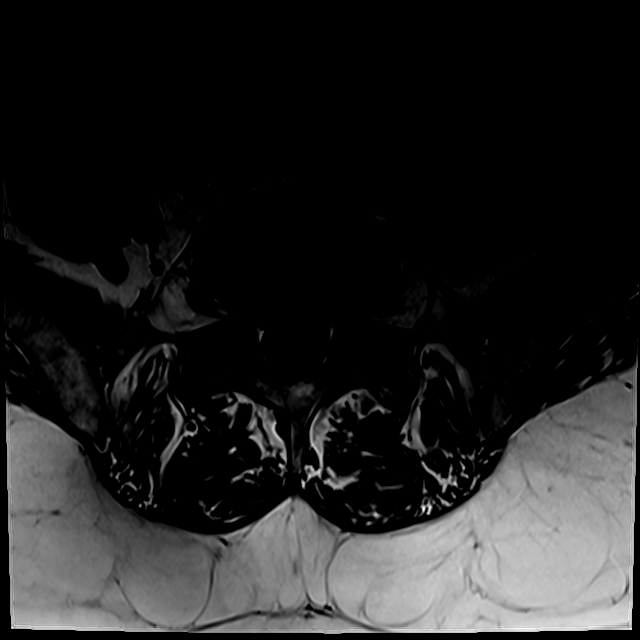
[im 23/43]
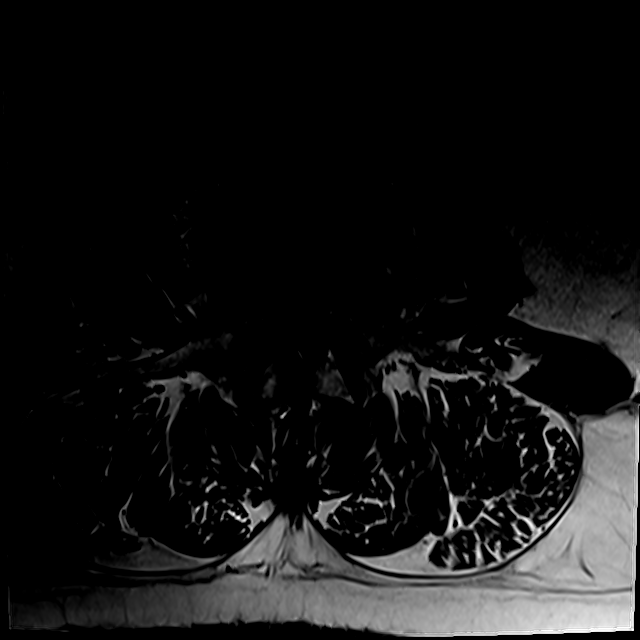
[im 37/43]
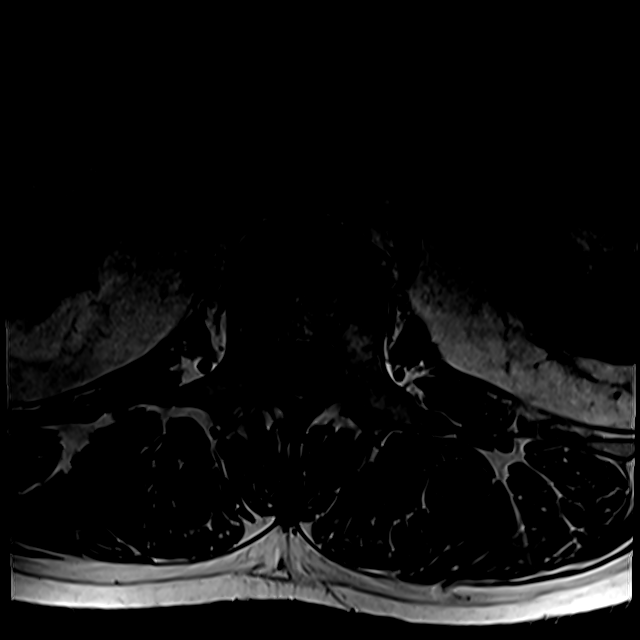

[Series 14: T2 · axial · 4.0mm · 0.28mm/px · z∈[-205,-8]mm · 5 of 43 slices shown (2 of 2)]
[im 3/43]
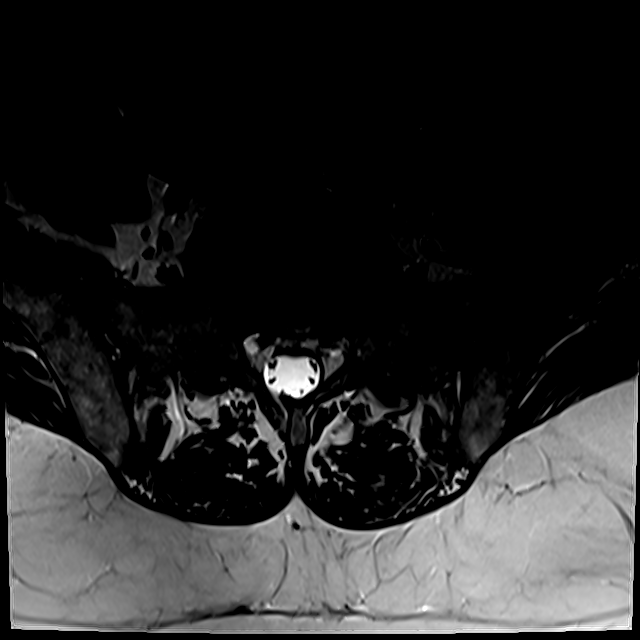
[im 6/43]
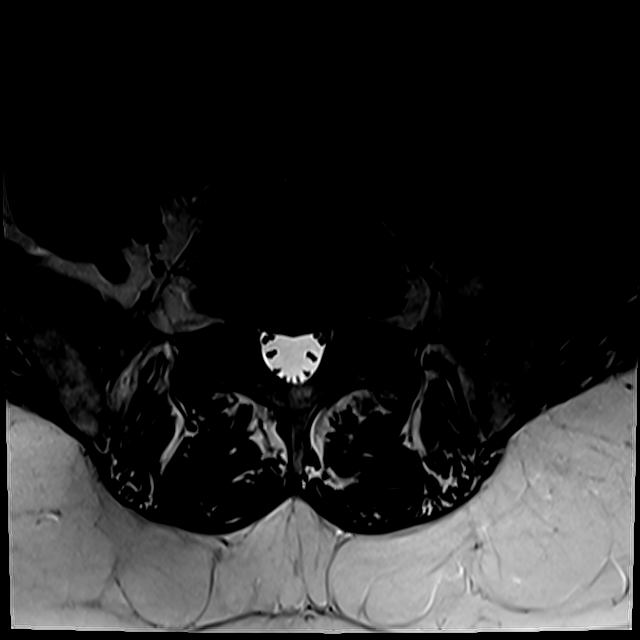
[im 9/43]
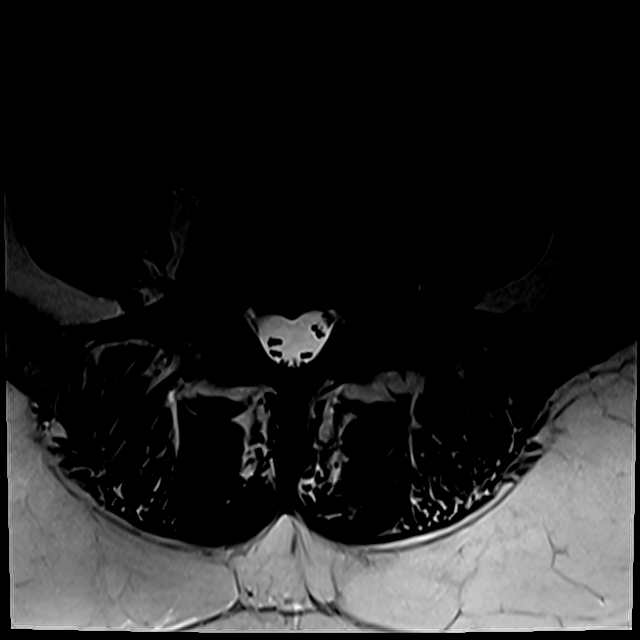
[im 23/43]
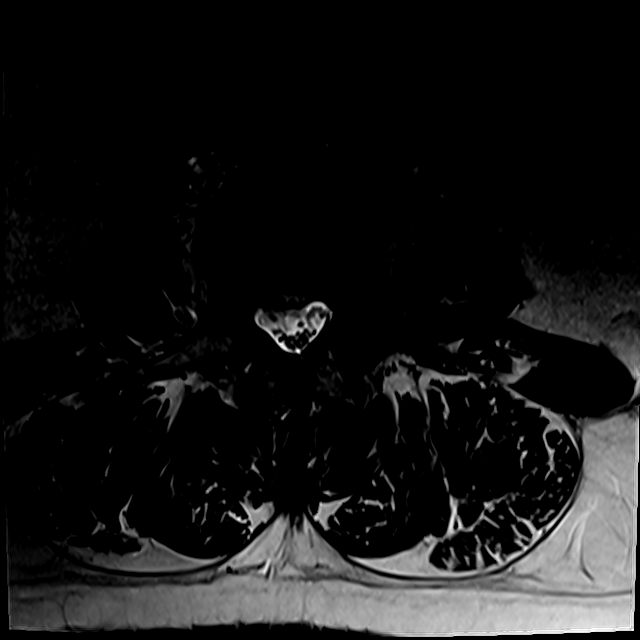
[im 37/43]
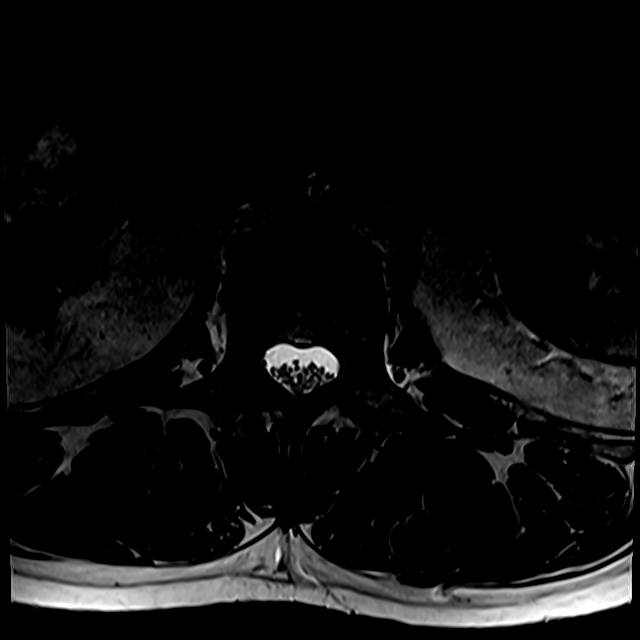

[17 of 48 positions shown; findings below may reference images not displayed]

FINDINGS: Segmentation: 5 lumbar type vertebral bodies as numbered previously.

Alignment: 2 mm retrolisthesis L1-2. 3 mm retrolisthesis L2-3. 2 mm
retrolisthesis L3-4.

Vertebrae: Benign appearing hemangioma within the left side of the
L2 vertebral body. Chronic degenerative endplate changes on the
right at L2-3, with some marrow edema that could be associated with
pain.

Conus medullaris and cauda equina: Conus extends to the T12-L1
level. Conus and cauda equina appear normal.

Paraspinal and other soft tissues: Negative

Disc levels:

T11-12: Central disc herniation indents the ventral subarachnoid
space but does not visibly compress the cord or show foraminal
extension.

T12-L1: Disc degeneration and disc bulge that indents the ventral
subarachnoid space. No compressive stenosis. This has worsened since
5229.

L1-2: Endplate osteophytes and shallow protrusion of the disc more
prominent towards the right. Facet and ligamentous hypertrophy.
Stenosis of the lateral recesses right more than left. Neural
compression could occur on the right at this level. This has
worsened since the previous study.

L2-3: Advanced chronic disc degeneration with loss of disc height
worse on the right. 3 mm retrolisthesis. Endplate osteophytes.
Broad-based disc herniation with caudal migration of disc material
more to the right of midline. Facet and ligamentous hypertrophy.
Severe spinal stenosis at this level that could cause neural
compression on either or both sides, more likely the right. This has
worsened considerably since the previous study.

L3-4: 2 mm retrolisthesis. Endplate osteophytes and bulging of the
disc. Left posterolateral to foraminal disc protrusion. Mild
stenosis of both lateral recesses. Left foraminal stenosis that
could affect the exiting L3 nerve. This has worsened since the
previous study.

L4-5: Bulging of the disc. Mild facet and ligamentous hypertrophy.
Mild narrowing of the lateral recesses and foramina but without
likely neural compression.

L5-S1: Minimal disc bulge.  No canal or foraminal stenosis.
IMPRESSION: L2-3: 3 mm retrolisthesis. Disc degeneration with endplate
osteophytes and broad-based herniation of disc material more
prominent towards the right, with caudal migration more prominent
towards the right. Facet and ligamentous hypertrophy. Severe spinal
stenosis that could cause neural compression on either or both
sides, particularly the right.

T11-12: Central disc herniation but without apparent neural
compression.

L1-2: Endplate osteophytes and shallow disc protrusion more
prominent towards the right. Stenosis of the lateral recesses right
more than left. Potential for neural compression in the right
lateral recess.

L3-4: 2 mm retrolisthesis. Shallow protrusion of the disc with focal
herniation in the left foraminal region. Bilateral lateral recess
narrowing but without distinct neural compression. Left foraminal
encroachment could focally affect the left L3 nerve.

L4-5: Disc bulge. Mild stenosis of both lateral recesses and
foramina but without likely neural compression.

## 2021-05-03 ENCOUNTER — Other Ambulatory Visit: Payer: Self-pay | Admitting: Radiology

## 2021-05-03 MED ORDER — GABAPENTIN 300 MG PO CAPS: ORAL_CAPSULE | ORAL | 2 refills | Status: AC
